# Patient Record
Sex: Male | Born: 1935 | Race: White | Hispanic: No | Marital: Married
Health system: Southern US, Community
[De-identification: ages and names within clinical notes are randomized; demographics above are authoritative.]

---

## 2006-11-24 ENCOUNTER — Ambulatory Visit: Payer: Self-pay | Admitting: Ophthalmology

## 2006-12-01 ENCOUNTER — Ambulatory Visit: Payer: Self-pay | Admitting: Ophthalmology

## 2007-05-06 ENCOUNTER — Ambulatory Visit: Payer: Self-pay | Admitting: Gastroenterology

## 2011-07-17 ENCOUNTER — Ambulatory Visit: Payer: Self-pay | Admitting: Orthopedic Surgery

## 2011-07-29 ENCOUNTER — Ambulatory Visit: Payer: Self-pay | Admitting: Orthopedic Surgery

## 2011-08-08 ENCOUNTER — Inpatient Hospital Stay: Payer: Self-pay | Admitting: Orthopedic Surgery

## 2011-08-12 ENCOUNTER — Encounter: Payer: Self-pay | Admitting: Internal Medicine

## 2011-08-12 LAB — PATHOLOGY REPORT

## 2011-08-17 ENCOUNTER — Encounter: Payer: Self-pay | Admitting: Internal Medicine

## 2011-09-16 ENCOUNTER — Encounter: Payer: Self-pay | Admitting: Internal Medicine

## 2011-12-26 ENCOUNTER — Inpatient Hospital Stay: Payer: Self-pay | Admitting: Orthopedic Surgery

## 2011-12-26 LAB — CBC
HGB: 11.9 g/dL — ABNORMAL LOW (ref 13.0–18.0)
MCH: 30.4 pg (ref 26.0–34.0)
Platelet: 178 10*3/uL (ref 150–440)
RBC: 3.9 10*6/uL — ABNORMAL LOW (ref 4.40–5.90)
WBC: 7.1 10*3/uL (ref 3.8–10.6)

## 2011-12-26 LAB — PROTIME-INR
INR: 1.1
Prothrombin Time: 14.4 secs (ref 11.5–14.7)

## 2011-12-26 LAB — TROPONIN I: Troponin-I: <0.02 ng/mL

## 2011-12-26 LAB — COMPREHENSIVE METABOLIC PANEL
Alkaline Phosphatase: 84 U/L (ref 50–136)
Anion Gap: 12 (ref 7–16)
BUN: 19 mg/dL — ABNORMAL HIGH (ref 7–18)
Bilirubin,Total: 0.5 mg/dL (ref 0.2–1.0)
Chloride: 104 mmol/L (ref 98–107)
Co2: 24 mmol/L (ref 21–32)
Creatinine: 1.14 mg/dL (ref 0.60–1.30)
EGFR (Non-African Amer.): >60
Glucose: 169 mg/dL — ABNORMAL HIGH (ref 65–99)
Osmolality: 286 (ref 275–301)
SGPT (ALT): 19 U/L
Sodium: 140 mmol/L (ref 136–145)
Total Protein: 7.3 g/dL (ref 6.4–8.2)

## 2011-12-26 LAB — URINALYSIS, COMPLETE
Bacteria: NONE SEEN
Bilirubin,UR: NEGATIVE
Blood: NEGATIVE
Glucose,UR: NEGATIVE mg/dL (ref 0–75)
Ketone: NEGATIVE
Specific Gravity: 1.016 (ref 1.003–1.030)
WBC UR: <1 /HPF (ref 0–5)

## 2011-12-27 LAB — CBC WITH DIFFERENTIAL/PLATELET
Basophil #: 0 10*3/uL (ref 0.0–0.1)
Basophil %: 0.3 %
Basophil %: 0.4 %
Eosinophil #: 0 10*3/uL (ref 0.0–0.7)
Eosinophil %: 0.1 %
HCT: 26.5 % — ABNORMAL LOW (ref 40.0–52.0)
HGB: 10.1 g/dL — ABNORMAL LOW (ref 13.0–18.0)
Lymphocyte #: 0.7 10*3/uL — ABNORMAL LOW (ref 1.0–3.6)
Lymphocyte #: 0.7 10*3/uL — ABNORMAL LOW (ref 1.0–3.6)
Lymphocyte %: 10.2 %
Lymphocyte %: 9.9 %
MCH: 31.2 pg (ref 26.0–34.0)
MCHC: 33.8 g/dL (ref 32.0–36.0)
Monocyte #: 0.5 10*3/uL (ref 0.0–0.7)
Monocyte #: 0.8 10*3/uL — ABNORMAL HIGH (ref 0.0–0.7)
Monocyte %: 8.2 %
Neutrophil %: 81.2 %
Neutrophil %: 76.5 %
Platelet: 143 10*3/uL — ABNORMAL LOW (ref 150–440)
Platelet: 125 10*3/uL — ABNORMAL LOW (ref 150–440)
RBC: 3.32 10*6/uL — ABNORMAL LOW (ref 4.40–5.90)
RDW: 16.1 % — ABNORMAL HIGH (ref 11.5–14.5)
WBC: 6.4 10*3/uL (ref 3.8–10.6)

## 2011-12-27 LAB — BASIC METABOLIC PANEL
Anion Gap: 12 (ref 7–16)
BUN: 21 mg/dL — ABNORMAL HIGH (ref 7–18)
Co2: 23 mmol/L (ref 21–32)
Creatinine: 1.15 mg/dL (ref 0.60–1.30)
EGFR (African American): >60
EGFR (Non-African Amer.): >60
Osmolality: 287 (ref 275–301)
Sodium: 140 mmol/L (ref 136–145)

## 2011-12-28 LAB — BASIC METABOLIC PANEL
Anion Gap: 10 (ref 7–16)
BUN: 18 mg/dL (ref 7–18)
Calcium, Total: 7.7 mg/dL — ABNORMAL LOW (ref 8.5–10.1)
Chloride: 106 mmol/L (ref 98–107)
Glucose: 134 mg/dL — ABNORMAL HIGH (ref 65–99)
Osmolality: 281 (ref 275–301)
Potassium: 4 mmol/L (ref 3.5–5.1)
Sodium: 139 mmol/L (ref 136–145)

## 2011-12-28 LAB — CBC WITH DIFFERENTIAL/PLATELET
Basophil #: 0 10*3/uL (ref 0.0–0.1)
Basophil %: 0.4 %
Eosinophil #: 0 10*3/uL (ref 0.0–0.7)
Lymphocyte #: 0.9 10*3/uL — ABNORMAL LOW (ref 1.0–3.6)
Lymphocyte %: 17.8 %
MCV: 93 fL (ref 80–100)
Monocyte #: 0.8 10*3/uL — ABNORMAL HIGH (ref 0.0–0.7)
Neutrophil #: 3.4 10*3/uL (ref 1.4–6.5)
Neutrophil %: 66.4 %
RBC: 2.53 10*6/uL — ABNORMAL LOW (ref 4.40–5.90)
RDW: 15.9 % — ABNORMAL HIGH (ref 11.5–14.5)
WBC: 5.2 10*3/uL (ref 3.8–10.6)

## 2011-12-29 LAB — CBC WITH DIFFERENTIAL/PLATELET
Basophil %: 0.2 %
Eosinophil #: 0.1 10*3/uL (ref 0.0–0.7)
Eosinophil %: 2.1 %
HGB: 9.6 g/dL — ABNORMAL LOW (ref 13.0–18.0)
Lymphocyte #: 1.2 10*3/uL (ref 1.0–3.6)
Lymphocyte %: 16.6 %
MCH: 30.4 pg (ref 26.0–34.0)
MCHC: 33.2 g/dL (ref 32.0–36.0)
MCV: 92 fL (ref 80–100)
Monocyte #: 0.8 10*3/uL — ABNORMAL HIGH (ref 0.0–0.7)
Neutrophil #: 5 10*3/uL (ref 1.4–6.5)
WBC: 7.1 10*3/uL (ref 3.8–10.6)

## 2011-12-30 LAB — CBC WITH DIFFERENTIAL/PLATELET
Basophil #: 0 10*3/uL (ref 0.0–0.1)
Eosinophil #: 0.2 10*3/uL (ref 0.0–0.7)
HCT: 28 % — ABNORMAL LOW (ref 40.0–52.0)
HGB: 9.1 g/dL — ABNORMAL LOW (ref 13.0–18.0)
Lymphocyte #: 0.8 10*3/uL — ABNORMAL LOW (ref 1.0–3.6)
Lymphocyte %: 14.3 %
MCH: 30 pg (ref 26.0–34.0)
MCHC: 32.4 g/dL (ref 32.0–36.0)
MCV: 93 fL (ref 80–100)
Monocyte #: 0.6 10*3/uL (ref 0.0–0.7)
Neutrophil %: 70.4 %
Platelet: 110 10*3/uL — ABNORMAL LOW (ref 150–440)
RDW: 16.5 % — ABNORMAL HIGH (ref 11.5–14.5)
WBC: 5.7 10*3/uL (ref 3.8–10.6)

## 2011-12-31 ENCOUNTER — Encounter: Payer: Self-pay | Admitting: Internal Medicine

## 2011-12-31 LAB — CBC WITH DIFFERENTIAL/PLATELET
Basophil #: 0 10*3/uL (ref 0.0–0.1)
Eosinophil #: 0.4 10*3/uL (ref 0.0–0.7)
Eosinophil %: 8.7 %
HCT: 26.6 % — ABNORMAL LOW (ref 40.0–52.0)
HGB: 8.7 g/dL — ABNORMAL LOW (ref 13.0–18.0)
Lymphocyte %: 18.1 %
MCH: 30.2 pg (ref 26.0–34.0)
MCHC: 32.7 g/dL (ref 32.0–36.0)
Monocyte #: 0.5 10*3/uL (ref 0.0–0.7)
Monocyte %: 10.8 %
Neutrophil %: 62 %
Platelet: 117 10*3/uL — ABNORMAL LOW (ref 150–440)
WBC: 4.5 10*3/uL (ref 3.8–10.6)

## 2011-12-31 LAB — BASIC METABOLIC PANEL
Calcium, Total: 7.7 mg/dL — ABNORMAL LOW (ref 8.5–10.1)
Co2: 22 mmol/L (ref 21–32)
EGFR (Non-African Amer.): >60
Osmolality: 287 (ref 275–301)
Potassium: 3.8 mmol/L (ref 3.5–5.1)
Sodium: 142 mmol/L (ref 136–145)

## 2012-01-17 ENCOUNTER — Encounter: Payer: Self-pay | Admitting: Internal Medicine

## 2014-02-11 ENCOUNTER — Ambulatory Visit: Payer: Self-pay | Admitting: Orthopedic Surgery

## 2015-02-16 ENCOUNTER — Inpatient Hospital Stay: Payer: Self-pay | Admitting: Internal Medicine

## 2015-03-20 ENCOUNTER — Inpatient Hospital Stay
Admit: 2015-03-20 | Disposition: A | Discharge status: Skilled Nursing Facility | Payer: Self-pay | Attending: Internal Medicine | Admitting: Internal Medicine

## 2015-03-20 LAB — CBC
HCT: 34.4 % — ABNORMAL LOW (ref 40.0–52.0)
HGB: 10.9 g/dL — ABNORMAL LOW (ref 13.0–18.0)
MCH: 29.4 pg (ref 26.0–34.0)
MCHC: 31.8 g/dL — AB (ref 32.0–36.0)
MCV: 93 fL (ref 80–100)
Platelet: 219 10*3/uL (ref 150–440)
RBC: 3.71 10*6/uL — ABNORMAL LOW (ref 4.40–5.90)
RDW: 17.1 % — ABNORMAL HIGH (ref 11.5–14.5)
WBC: 8.4 10*3/uL (ref 3.8–10.6)

## 2015-03-20 LAB — BASIC METABOLIC PANEL
ANION GAP: 9 (ref 7–16)
BUN: 12 mg/dL
Calcium, Total: 8.4 mg/dL — ABNORMAL LOW
Chloride: 107 mmol/L
Co2: 25 mmol/L
Creatinine: 0.94 mg/dL
EGFR (African American): >60
EGFR (Non-African Amer.): >60
Glucose: 126 mg/dL — ABNORMAL HIGH
Potassium: 3.8 mmol/L
SODIUM: 141 mmol/L

## 2015-03-20 LAB — TROPONIN I
Troponin-I: 0.18 ng/mL — ABNORMAL HIGH
Troponin-I: 0.18 ng/mL — ABNORMAL HIGH
Troponin-I: 0.18 ng/mL — ABNORMAL HIGH

## 2015-03-20 LAB — PRO B NATRIURETIC PEPTIDE: B-Type Natriuretic Peptide: 701 pg/mL — ABNORMAL HIGH

## 2015-03-20 LAB — RAPID INFLUENZA A&B ANTIGENS

## 2015-03-21 LAB — LIPID PANEL
CHOLESTEROL: 144 mg/dL
HDL Cholesterol: 28 mg/dL — ABNORMAL LOW
LDL CHOLESTEROL, CALC: 98 mg/dL
TRIGLYCERIDES: 91 mg/dL
VLDL Cholesterol, Calc: 18 mg/dL

## 2015-03-21 LAB — BASIC METABOLIC PANEL
Anion Gap: 7 (ref 7–16)
BUN: 12 mg/dL
CHLORIDE: 100 mmol/L — AB
Calcium, Total: 8.2 mg/dL — ABNORMAL LOW
Co2: 32 mmol/L
Creatinine: 1.12 mg/dL
EGFR (Non-African Amer.): 58 — ABNORMAL LOW
Glucose: 121 mg/dL — ABNORMAL HIGH
Potassium: 3.4 mmol/L — ABNORMAL LOW
SODIUM: 139 mmol/L

## 2015-03-21 LAB — CBC WITH DIFFERENTIAL/PLATELET
BASOS ABS: 0 10*3/uL (ref 0.0–0.1)
Basophil %: 0.7 %
Eosinophil #: 0.1 10*3/uL (ref 0.0–0.7)
Eosinophil %: 1.8 %
HCT: 33.5 % — AB (ref 40.0–52.0)
HGB: 10.5 g/dL — ABNORMAL LOW (ref 13.0–18.0)
Lymphocyte #: 1.1 10*3/uL (ref 1.0–3.6)
Lymphocyte %: 23.6 %
MCH: 29.1 pg (ref 26.0–34.0)
MCHC: 31.2 g/dL — ABNORMAL LOW (ref 32.0–36.0)
MCV: 93 fL (ref 80–100)
MONO ABS: 0.3 x10 3/mm (ref 0.2–1.0)
MONOS PCT: 7.3 %
Neutrophil #: 3.1 10*3/uL (ref 1.4–6.5)
Neutrophil %: 66.6 %
PLATELETS: 226 10*3/uL (ref 150–440)
RBC: 3.6 10*6/uL — ABNORMAL LOW (ref 4.40–5.90)
RDW: 16.6 % — AB (ref 11.5–14.5)
WBC: 4.7 10*3/uL (ref 3.8–10.6)

## 2015-03-22 LAB — BASIC METABOLIC PANEL
ANION GAP: 8 (ref 7–16)
BUN: 15 mg/dL
CALCIUM: 8.5 mg/dL — AB
CHLORIDE: 101 mmol/L
Co2: 31 mmol/L
Creatinine: 1.14 mg/dL
EGFR (African American): >60
GFR CALC NON AF AMER: 56 — AB
Glucose: 107 mg/dL — ABNORMAL HIGH
Potassium: 3.7 mmol/L
Sodium: 140 mmol/L

## 2015-03-22 LAB — MAGNESIUM: Magnesium: 1.9 mg/dL

## 2015-04-09 NOTE — Consult Note (Signed)
Brief Consult Note: Diagnosis: Preop Evaluation, HTN, diet controlled DM, hyperlipidemia, now with Right proximal femur fracture.   Patient was seen by consultant.   Consult note dictated.   Recommend to proceed with surgery or procedure.   Orders entered.   Comments: EKG nsr, no acute ischemia, pt had [facility] in [facility]/12 no ischemia, pt had left TKR in 8/12 without any problems, pt is active, no cardiac symptoms, low cardiac risk for procedure, cont lisinopril.  Electronic Signatures: Fredia SorrowGupta, Shantea Poulton (MD)  (Signed 10-Jan-13 20:55)  Authored: Brief Consult Note   Last Updated: 10-Jan-13 20:55 by Fredia SorrowGupta, Lilyana Lippman (MD)

## 2015-04-09 NOTE — Discharge Summary (Signed)
PATIENT NAME:  Philip [name], Philip [name] MR#:  [id number] DATE OF BIRTH:  1922/12/29  DATE OF ADMISSION:  12/26/2011 DATE OF DISCHARGE:  12/31/2011  ADMISSION DIAGNOSIS: Right intertrochanteric hip fracture.   DISCHARGE DIAGNOSES:  1. Right intertrochanteric hip fracture treated with intramedullary fixation.  2. Acute postoperative blood loss anemia.   HISTORY OF PRESENT ILLNESS: Mr. Philip [name] is an [age] year old male who fell off a stepladder at home onto his right side. He was brought to [facility] where he was diagnosed with an intertrochanteric hip fracture. Patient was admitted to the orthopedic surgery service for further evaluation and management.   HOSPITAL COURSE: Patient was seen by the [facility] medicine service on 12/26/2011. He was cleared for surgery at that time.   Patient was brought to the Operating Room on 12/27/2011 and underwent an uncomplicated open reduction internal fixation with a long intramedullary rod. Postoperatively, he was brought back to the orthopedic floor. On postoperative day #1 patient was found to have hematocrit of 23 with a hemoglobin level of 7.8. For this 2 units of packed red blood cells were transfused. Patient otherwise was doing well and was up out of bed to a chair. Physical and occupational therapy consults were ordered for postoperative day #1 and they continued to follow him throughout his hospital stay. Patient made adequate progress with therapy throughout his hospitalization. Patient had 24 hours of antibiotics postop. He is on vancomycin due to a penicillin allergy. He received two doses postoperatively. On postoperative day #2 the patient's Foley catheter was removed. His hematocrit was up to 28. Patient was able to have a bowel movement as well. Patient was up and ambulating, was able to walk to the door of his room. On postoperative day #3 patient continued to do well. He continued to progress with physical therapy. On  postoperative day #4 patient again was doing well. He was sitting up eating lunch. He denied any right hip pain. He had been up and walking with physical therapy and increasing his distance. Patient had mild loose stools. His vital signs were stable. His hematocrit was 26.6. His electrolytes were within normal limits.   Given the patient's clinical improvement, he is prepared for discharge to rehab.   DISCHARGE INSTRUCTIONS: Patient will be discharged with instructions to continue physical and occupational therapy. He will be weight-bearing as tolerated on the right lower extremity. He should elevate the right lower extremity whenever he is in bed. He will continue on enteric-coated aspirin 325 mg daily for deep vein thrombosis prophylaxis. His staples will be removed in the orthopedic office upon his first follow up in 7 to 10 days. Patient should have a CBC checked within 48 hours of [facility] to ensure a stable hematocrit. He may apply ice to the right hip to help reduce swelling. Patient will remain on Vicodin 5/325 mg 1 to 2 tablets q.4-6 hours p.[name].n. for pain.    DISCHARGE MEDICATIONS: Patient may restart his home medications. He will also be on: 1. Norco 5/325 mg 1 to 2 tabs every 4 to 6 hours p.[name].n. for pain.  2. Enteric-coated aspirin 325 mg daily.  3. Calcium carbonate 500 mg with 200 international units vitamin D 1 tablet b.i.d. with meals. 4. Celebrex 200 mg p.o. daily.  5. Ferrous sulfate 325 mg p.o. b.i.d.  6. Lisinopril 5 mg p.o. daily. 7. Fosamax 70 mg q. week. He is to take this medication with 6 to 8 ounces of water at least 30 minutes before  his first meal of the day and he must remain upright for at least 30 minutes after taking the tablet.  8. Colace 100 mg b.i.d.  9. While he was an inpatient he was covered by insulin sliding scale with fingersticks before each meals and at bedtime. If appropriate he may continue sliding scale at rehab.  10. MiraLax 17 grams p.o.  daily p.[name].n. for constipation.   ____________________________ Kathreen Devoid, MD klk:cms D: 12/31/2011 12:34:24 ET T: 12/31/2011 13:04:30 ET JOB#: [id number]  cc: Kathreen Devoid, MD, <Dictator> Kathreen Devoid MD ELECTRONICALLY SIGNED 01/01/2012 18:28

## 2015-04-09 NOTE — Op Note (Signed)
PATIENT NAME:  Philip [name], Philip [name] MR#:  [id number] DATE OF BIRTH:  [birth year]  DATE OF PROCEDURE:  12/27/2011  PREOPERATIVE DIAGNOSIS: Right transverse, displaced intertrochanteric hip fracture.   POSTOPERATIVE DIAGNOSIS: Right transverse, displaced intertrochanteric hip fracture.  PROCEDURE: Open reduction internal fixation of right intertrochanteric hip fracture with intramedullary nail.   SURGEON: Kathreen DevoidKevin L. Madeliene Tejera, MD   ANESTHESIA: Spinal.   ESTIMATED BLOOD LOSS: 150 mL.   COMPLICATIONS: None.   INDICATIONS FOR PROCEDURE: The patient is an 81 year old male. He is an independent ambulator at baseline and sustained a mechanical fall off a stepladder at home yesterday. The patient landed on his right side when he impacted the ground. He was brought to the [facility] where he was diagnosed with the above-noted fracture by x-ray. He was admitted to my service for further evaluation and management. Given the transverse nature of the fracture and the displacement, I have recommended surgery for open reduction internal fixation. I discussed the risks and benefits of surgery with the patient and his family this morning at the bedside. They understand the risks include infection, bleeding requiring blood transfusion, nerve or blood vessel injury, malunion, nonunion, leg length discrepancy, change in leg rotation as well as persistent right hip pain, osteoarthritis, and the need for further surgery including conversion to a right total hip arthroplasty. Medical complications they understand include DVT and pulmonary embolism, myocardial infarction, pneumonia, stroke, respiratory failure and death. The patient signed consent form for blood transfusion and surgery at the bedside this morning in the presence of his wife and daughter as well as the nurse, Selena BattenKim.   PROCEDURE NOTE: The patient had his right hip signed with the word yes according to the right site protocol. He was  brought up to the operating room where he underwent a spinal anesthetic placed by our anesthesia service. He was then positioned supine on a fracture table. The right leg was placed in a legholder with traction. The left well leg was placed in a hemi- lithotomy position. The patient was then prepped and draped in a sterile fashion. Time-out was performed to verify the patient's name, date of birth, medical record number, correct site of surgery, and correct procedure to be performed. It was also used to verify the patient had received antibiotics and that all appropriate instruments, implants, and radiographic studies were available in the room. Once all in attendance were in agreement, the case began. The patient had 1 gram of vancomycin before the incision was made because he has a penicillin allergy.   The hip was placed in traction and internally and externally rotated in an effort to close reduce the fracture. Unfortunately, this was not possible to get the fracture well aligned through closed techniques. Therefore, after the patient was prepped and draped in a sterile fashion a lateral incision was made centered just below the greater trochanter. The incision was approximately 8 cm in length. The overlying subcutaneous tissue was cleared off the vastus lateralis with a [employer] therapist[employer] elevator. The vastus lateralis was then sharply incised revealing the underlying vastus lateralis. The vastus lateralis was found to be quite edematous and there was hematoma within it. The vastus lateralis was split bluntly within its fibers. Hematoma was evacuated and the fracture was palpated. A three-pronged fracture clamp was then placed around the fracture. This was then carefully tightened to pull the fracture into reduction. There had been translation of the shaft medially in relation to the proximal fragment which was slightly abducted and laterally  displaced. There was a fragment of the lesser trochanter off. Following placement  of "[place] claw" fracture clamp, the fracture was well aligned. A second incision was then made above the greater trochanter approximately 3 cm in length to allow insertion of a guidepin for intramedullary nail to be placed into the tip of the greater trochanter. This guidepin was advanced into the proximal femur and across the fracture site. Its position was checked on AP and lateral C-arm images. Once the guidepin was in good position, the guidepin was overdrilled with a proximal femoral drill. This was 15 mm in diameter. The soft tissue was protected from this drill with a drill tissue protecting guide. The drill was then removed. A long ball-tip guidewire was then placed through the proximal drill hole and into the femoral shaft and guided down to the knee. Again, the guidewire's position was confirmed on AP and lateral C-arm projections both proximally and distally at the knee. The length of the intramedullary nail was then measured. It was found a 420 mm nail would be adequate length. An 11 mm x 420 mm intramedullary nail was then inserted over the ball-tip guidewire. Its position was confirmed again proximally and distally on AP and lateral C-arm projections. Once the nail was in adequate position, the fracture clamp was released. The drill guide for the lag screw was then placed through the guide arm of the intramedullary nail. The guidepin was then advanced across the fracture and into the femoral head. It was measured to be 110 mm in length. A lag screw drill was then used to drill a hole of 110 mm in depth. The position of the lag screw guidewire had been confirmed on both the AP and lateral projections to ensure a tip apex distance less than 25 mm. The 110 mm lag screw was then advanced by hand into position fixing the proximal portion of the fracture. Again, the construct was checked on AP and lateral C-arm projections. The screw within the superior portion of the nail was then tightened into position  and backed off a half turn. This allowed stability of the lag screw and prevented rotation and back out of the lag screw once positioned. The guide arm was then removed from the proximal portion of the nail. The attention was then turned to distal fixation.   A perfect circle technique was utilized to place a single distal interlocking screw through the oblong hole. This would allow for compression at the fracture site. Prior to placement of the distal interlocking screw, traction of the right lower extremity was removed. A single 52 mm distal interlocking screw was placed to allow for fixation distally. Once drilled and in position, the final position of the distal interlocking screw was again confirmed on AP and lateral C-arm images. Final C-arm images were then performed of the entire right intramedullary nail. The wounds were then copiously irrigated. The fascia lata was closed with interrupted 0 Vicryl in both the superior and middle incisions. The subcutaneous tissue was closed with 2-0 Vicryl in all three incisions and the skin was approximated with staples. Dry sterile dressings were applied over all three incisions. I was scrubbed and present for the entire case and all sharp and instrument counts were correct at the conclusion of the case. The patient was brought to the PAC-U in stable condition.   I spoke with the patient's family in the postop consultation room to let them know that the case had gone without complication and the patient  was stable in the recovery room.    ____________________________ Kathreen Devoid, MD klk:drc D: 12/27/2011 17:19:37 ET T: 12/28/2011 09:20:20 ET JOB#: [id number]  cc: Kathreen Devoid, MD, <Dictator> Kathreen Devoid MD ELECTRONICALLY SIGNED 01/01/2012 18:28

## 2015-04-09 NOTE — Consult Note (Signed)
PATIENT NAME:  Philip [name], Xzavior R MR#:  [id number] DATE OF BIRTH:  Oct 16, 1923  DATE OF CONSULTATION:  12/26/2011  REFERRING PHYSICIAN:  Juanell FairlyKevin Krasinski, MD  CONSULTING PHYSICIAN:  Fredia SorrowAbhinav Philopateer Strine, MD  PRIMARY CARE PHYSICIAN: Alonna BucklerAndrew Lamb, MD   REASON FOR CONSULTATION: Preop evaluation, EKG interpretation.   CHIEF COMPLAINT: I feel like some heavy sensation in the right hip area.   HISTORY OF PRESENT ILLNESS: This is an 81 year old male who is fairly healthy. He has history of hypertension, diabetes which is diet controlled, history of prostate cancer, and hyperlipidemia. He had a left total knee replacement done in August of 2012. Today while he was on the ladder, he was only two steps on the ladder, and when he climbed down he fell and he fell on his right side sustaining a right hip fracture. His right hip x-ray showed a proximal right femoral fracture. He is not complaining of any significant pain in the right hip area right now. He denies any chest pain, shortness of breath, abdominal pain, dizziness, nausea or vomiting. He denies any loss of consciousness. He says he is very active. He does household work. He does yard work. He did well with his previous surgery, left knee replacement. He had a good recovery and he was mowing the lawn after his left total knee replacement. He denies any chest pains on exertion, any shortness of breath on exertion. He said he had a stress test that was done prior to left total knee replacement.  He had a stress echo done in June of 2012 which showed normal stress echocardiographic images without evidence of ischemia, poor exercise tolerance for age. Normal EKG without evidence of ischemia.   PAST MEDICAL HISTORY:  1. Hypertension.  2. Diabetes, diet controlled. 3. History of prostate cancer.  4. Hyperlipidemia.  5. Gout.  6. Severe degenerative disease of his knees.   PAST SURGICAL HISTORY: 1. Left total knee replacement in August of 2012. 2. Prostate  resection in 1995.   ALLERGIES TO MEDICATIONS: Penicillin.   HOME MEDICATIONS: 1. Garlic 400 mg daily.  2. Lisinopril 5 mg daily.  3. Aspirin 81 mg daily.  4. Multivitamin. 5. MiraLAX as needed.   SOCIAL HISTORY: He lives with his wife. No smoking or alcohol use.   FAMILY HISTORY: Father died at age of 79. Mother died at 6890. His one son had heart problems. He was a smoker. One child died of brain cancer.   PHYSICAL EXAMINATION:   VITAL SIGNS: In the Emergency Room temperature 97.3, heart rate 81, respiratory rate 16, blood pressure 164/95, saturating 98% on room air.   GENERAL: This is an elderly Caucasian male, well built, comfortably lying in bed, very pleasant gentleman.  HEENT: Bilateral pupils are equal. Extraocular muscles are intact. No scleral icterus. No conjunctivitis. Oral mucosa is moist. No pallor.   NECK: No thyroid tenderness, enlargement, or nodules. Neck is supple. No masses, nontender. No adenopathy. No JVD. No carotid bruit.   CHEST: Bilateral breath sounds are clear anteriorly. No wheezing. Normal effort. No respiratory distress.   HEART: Heart sounds are regular. There is a murmur. Good peripheral pulses. No lower extremity edema.   ABDOMEN: Soft, nontender. Normal bowel sounds. No hepatosplenomegaly. No bruit. No masses.   RECTAL: Deferred.   NEUROLOGIC: He is awake, alert, oriented to time, place, and person. Cranial nerves are intact. He is moving bilateral upper and left lower extremity against gravity. Right lower extremity is externally rotated and immobilized right now but he  can move his right toes.   LABORATORY, DIAGNOSTIC, AND RADIOLOGICAL DATA: White count 7.1, hemoglobin 11.9, platelet count 178,000. BMP sodium 140, potassium 3.8, BUN 19, creatinine 1.14. CK 233. MB 7.0. Troponin negative. His EKG shows sinus rhythm, left axis deviation but essentially normal EKG. No acute ischemic changes. He has some Q waves in inferior leads but no change from  prior EKGs.   IMPRESSION:  1. Preoperative evaluation. 2. Hypertension. 3. Diet controlled diabetes.  4. History of prostate cancer.  5. Hyperlipidemia.  6. Proximal right femoral neck fracture.  PLAN: This is an 81 year old  male who has history of hypertension, diet controlled diabetes, and history of prostate cancer. He is fairly active. He has no exertional chest pains or dyspnea. He had a stress echo in June of 2012 that was normal. He underwent a left total knee replacement in August of 2012 without any complications. He presented with a right hip fracture today. His EKG does not show any acute ischemic changes, unchanged from prior EKGs. Will continue his low dose lisinopril. Continue IV hydration. There is no need for perioperative beta-blockers. He is low cardiac risk for procedure. Can proceed with surgery. Will continue to follow the patient while in the hospital.   TIME SPENT WITH CONSULTATION: 40 minutes.   ____________________________ Fredia Sorrow, MD ag:drc D: 12/26/2011 20:52:43 ET T: 12/27/2011 09:21:01 ET JOB#: [id number]  cc: Fredia Sorrow, MD, <Dictator>, Reola Mosher. Randa Lynn, MD, Kathreen Devoid, MD Fredia Sorrow MD ELECTRONICALLY SIGNED 01/10/2012 10:52

## 2015-04-09 NOTE — H&P (Signed)
Subjective/Chief Complaint Right intertrochanteric hip fracture    History of Present Illness Patient is an 81 y/o male sustained a mechanical fall off a step later yesterday at home.  He recalls hitting the ground with significant force onto the right side.  Patient is an independent ambulator at baseline.  He is seen this AM with his family at the bedside.   Past Med/Surgical Hx:  weber  christian disease:   gout:   NIDDM:   HTN:   [rpstate resection om 1995:   hydrocele:   surgical removal of bilateral cataracts:   left total knee replacement:   ALLERGIES:  Penicillin: Unknown  HOME MEDICATIONS:  lisinopril 5 mg oral tablet: 1 tab(s) orally once a day (in the morning), Active  aspirin 81 mg oral tablet: 1 tab(s) orally once a day, Active  multivitamin: 1 tab(s) orally once a day, Active  garlic oral tablet: 1 tab(s) orally once a day, Active  MiraLax oral powder for reconstitution: 30 gram(s) orally , As Needed, Active  Family and Social History:   Family History Non-Contributory    Place of Living Home   Review of Systems:   Subjective/Chief Complaint Right hip  pain    Fever/Chills No    Cough No    Abdominal Pain No    Nausea/Vomiting No    SOB/DOE No    Chest Pain No   Physical Exam:   GEN NAD    HEENT PERRL, hearing intact to voice, moist oral mucosa, Oropharynx clear    NECK supple  No masses  trachea midline    RESP normal resp effort  clear BS  no use of accessory muscles    CARD regular rate  no murmur  No LE edema  no JVD    ABD denies tenderness  no liver/spleen enlargement  soft  normal BS  no Adominal Mass    EXTR Right lower extremity without erythema or ecchymosis.  Right leg in Buck's traction.  NVI in bilateral lower extemities.  Motor function intact in both lower extremities.    SKIN normal to palpation    NEURO motor/sensory function intact    PSYCH alert, A+O to time, place, person   Cardiac:  10-Jan-13 18:23    CK,  Total 233   CPK-MB, Serum 7.0  Routine Hem:  10-Jan-13 18:23    WBC (CBC) 7.1   RBC (CBC) 3.90   Hemoglobin (CBC) 11.9   Hematocrit (CBC) 35.9   Platelet Count (CBC) 178   MCV 92   MCH 30.4   MCHC 33.1   RDW 16.5  Routine Chem:  10-Jan-13 18:23    Glucose, Serum 169   BUN 19   Creatinine (comp) 1.14   Sodium, Serum 140   Potassium, Serum 3.8   Chloride, Serum 104   CO2, Serum 24   Calcium (Total), Serum 8.7  Hepatic:  10-Jan-13 18:23    Bilirubin, Total 0.5   Alkaline Phosphatase 84   SGPT (ALT) 19   SGOT (AST) 27   Total Protein, Serum 7.3   Albumin, Serum 3.9  Routine Chem:  10-Jan-13 18:23    Osmolality (calc) 286   eGFR (African American) >60   eGFR (Non-African American) >60   Anion Gap 12  Cardiac:  10-Jan-13 18:23    Troponin I < 0.02  Routine Coag:  10-Jan-13 18:23    Activated PTT (APTT) 26.4   Prothrombin 14.4   INR 1.1  Routine BB:  10-Jan-13 20:13  Antibody Screen NEGATIVE   Crossmatch Unit 1 Ready   Crossmatch Unit 2 Ready  Routine UA:  10-Jan-13 21:33    Color (UA) Yellow   Clarity (UA) Clear   Glucose (UA) Negative   Bilirubin (UA) Negative   Ketones (UA) Negative   Specific Gravity (UA) 1.016   Blood (UA) Negative   pH (UA) 5.0   Protein (UA) Negative   Nitrite (UA) Negative   Leukocyte Esterase (UA) Negative   RBC (UA) 1 /HPF   WBC (UA) <1 /HPF   Mucous (UA) PRESENT  Routine Hem:  11-Jan-13 01:13    WBC (CBC) 6.4   RBC (CBC) 3.32   Hemoglobin (CBC) 10.1   Hematocrit (CBC) 30.8   Platelet Count (CBC) 143   MCV 93   MCH 30.4   MCHC 32.9   RDW 16.5  Routine Chem:  11-Jan-13 01:13    Glucose, Serum 178   BUN 21   Creatinine (comp) 1.15   Sodium, Serum 140   Potassium, Serum 4.2   Chloride, Serum 105   CO2, Serum 23   Calcium (Total), Serum 8.3   Osmolality (calc) 287   eGFR (African American) >60   eGFR (Non-African American) >60   Anion Gap 12  Routine Hem:  11-Jan-13 01:13    Neutrophil % 81.2   Lymphocyte  % 10.2   Monocyte % 8.2   Eosinophil % 0.1   Basophil % 0.3   Neutrophil # 5.2   Lymphocyte # 0.7   Monocyte # 0.5   Eosinophil # 0.0   Basophil # 0.0   Radiology Results: XRay:    10-Jan-13 18:38, Chest 1 View AP or PA   Chest 1 View AP or PA   PRELIMINARY REPORT    The following is a PRELIMINARY Radiology report.  A final report will follow pending radiologist verification.      REASON FOR EXAM:    preoperative  COMMENTS:       PROCEDURE: DXR - DXR CHEST 1 VIEWAP OR PA  - Dec 26 2011  6:38PM     RESULT: There is no previous exam for comparison.    A single projection shows slightly shallow inspiration with pulmonary   vascular prominence in the hilar regions. There may be some granulomatous   calcification in the area or prominent vessels. The cardiac silhouette   appears normal in size. The lung markings are slightly coarse. There is   no significant effusion or evidence of pneumothorax.    IMPRESSION:  Pulmonary vascular congestion. Given the supine position of   the patient, adenopathy is felt to be less likely. Granulomatous changes     are not excluded. No definite infiltrate or mass otherwise. The heart   appears to be within normal limits for the position and projection.    Thank you for the opportunity to contribute to the care of your patient.           Dictated By: Sundra Aland, M.D., MD    10-Jan-13 18:38, Hip Right Complete   Hip Right Complete   REASON FOR EXAM:    s/p fall, pain  COMMENTS:       PROCEDURE: DXR - DXR HIP RIGHT COMPLETE  - Dec 26 2011  6:38PM     RESULT: And intratrochanteric comminuted fracture of the right proximal   femur is present. No significant impaction or distractionis seen. The   femoral head remains in the acetabulum. The visualized pelvis appears   intact.  IMPRESSION:   1. Proximal right femoral fracture in the intertrochanteric region.          Verified By: Sundra Aland, M.D., MD    10-Jan-13  18:43, Pelvis AP Only   Pelvis AP Only   PRELIMINARY REPORT    The following is a PRELIMINARY Radiology report.  A final report will follow pending radiologist verification.      REASON FOR EXAM:    fall, pain  COMMENTS:       PROCEDURE: DXR - DXR PELVIS AP ONLY  - Dec 26 2011  6:43PM     RESULT: There is a comminuted intertrochanteric fracture of the proximal   right femur. Surgical clips from previous prostatectomy are present.   Degenerative changes are seen in the sacroiliac joints and lower lumbar   region.    IMPRESSION:   1. Intertrochanteric comminuted right femoral fracture.    Thank you for the opportunity to contribute to the care of your patient.       Dictated By: Sundra Aland, M.D., MD     Assessment/Admission Diagnosis Right transverse intertrochanteric hip fracture at the level of the lesser trochanter    Plan I have explained the injury to the patient and his family.  The patient is highly functioning and an independent ambulator at baseline.  Therefore I have recommended surgical fixation of this fracture with an intramedullary nail.  The risks and benefits of surgical intervention were discussed in detail with the patient. The patient expressed understanding of the risks and benefits and agreed with plans for surgery. The risks include, but are not limited to: infection, bleeding requiring transfusion, nerve and blood vessel injury, fracture, leg length discrepancy, change in lower extremity rotation, malunion, nonunion, hardware failure, hip pain, osteoarthritis, need for more surgery including conversion to a total hip arthroplasty, DVT, and PE, MI, stroke, pneumonia, respiratory failure and death.  The patient has been cleared for surgery by [facility].  He is NPO.  Surgery is scheduled for this morning.  I answered all questions by the patient and his family.  Initially the family had hope Dr. Rudene Christians would do his surgery, as he had performed the patient's left  total knee arthroplasty.  I have spoken to Dr. Rudene Christians this AM and he is unavailable to perform the operation in a timely fashion due to the fact that he is in clinic all day.  I have explained this to the patient's family.   Electronic Signatures: Thornton Park (MD)  (Signed 11-Jan-13 10:09)  Authored: CHIEF COMPLAINT and HISTORY, PAST MEDICAL/SURGIAL HISTORY, ALLERGIES, HOME MEDICATIONS, FAMILY AND SOCIAL HISTORY, REVIEW OF SYSTEMS, PHYSICAL EXAM, LABS, Radiology, ASSESSMENT AND PLAN   Last Updated: 11-Jan-13 10:09 by Thornton Park (MD)

## 2015-04-16 NOTE — Discharge Summary (Signed)
PATIENT NAME:  Philip [name], Philip [name] MR#:  [id number] DATE OF BIRTH:  Sep 21, 1923  DATE OF ADMISSION:  03/20/2015 DATE OF DISCHARGE:  03/21/2015  PRIMARY CARE PHYSICIAN: Kandyce Rud, MD   FINAL DIAGNOSES:  1. Acute systolic congestive heart failure, moderate mitral regurgitation.  2. Abnormal CT CAT scan of the chest. Followup CT scan of the chest in 6 weeks recommended. Questionable pneumonia.  3. Weakness.  4. Recent hip fracture.   MEDICATIONS ON DISCHARGE: Include Colace 100 mg 1 capsule at bedtime as needed for constipation, MiraLax 17 grams orally once a day, acetaminophen 325 mg every 4 to 6 hours as needed for mild pain, vitamin D3, 1000 international units 2 capsules daily, Aspercreme 10% topical cream applied to affected area twice a day, tramadol 50 mg every 4 hours as needed for moderate pain, Ramipril 5 mg 1 capsule orally daily, aspirin 81 mg daily, metoprolol 12.5 mg twice a day orally, Lasix 20 mg once a day, Aldactone 25 mg once a week, Levaquin 500 mg 1 tablet every 24 hours for 7 days.   DIET: Low sodium diet, regular consistency.   ACTIVITY: As tolerated.   FOLLOWUP: With doctor at rehab in 1 to 2 days. Physical therapy at rehab.   HOSPITAL COURSE: The patient was admitted 03/20/2015, and discharged 03/22/2015. The patient came in with shortness of breath, unclear etiology, was found to have congestive heart failure.   LABORATORY AND RADIOLOGICAL DATA DURING THE HOSPITAL COURSE: Included an EKG that showed sinus tachycardia, occasional PVCs. Glucose 126, BUN 12, creatinine 0.94. Sodium 141, potassium 3.8, chloride 107, CO2 of 25. Troponin borderline at 0.18. White blood cell count 8.4, hemoglobin and hematocrit 10.9 and 34.4, platelet count 219,000. Chest x-ray stable cardiomegaly, diffuse interstitial prominence, may reflect atypical infection or less likely pulmonary edema, bilateral lower air space opacities. BNP was elevated at 701. CT scan of the chest showed no  pulmonary embolism, evidence of congestive heart failure, focal opacity abutting the major fissure of the left upper lobe in the apex. Suspect a loculated effusion in the area. A mass could present in this manner. A followup study in 4 to 6 weeks to assess stability.   Two troponins borderline at 0.18. Echocardiogram showed an ejection fraction of 30% -35%, dilated left and right atrium, moderate mitral valve regurgitation, moderate aortic valve sclerosis.  LDL 98, HDL 28, triglycerides 91, creatinine upon discharge 1.14, potassium 3.7. Magnesium 1.9.   HOSPITAL COURSE PER PROBLEM LIST:  1. For the patient's acute systolic congestive heart failure with moderate mitral regurgitation, the patient was diuresed with IV Lasix with good response. Lungs are clear upon discharge. We will give Lasix 20 mg on a daily basis. I added Aldactone 25 mg once a week, Ramipril 5 mg daily and metoprolol 12.5 mg twice a day, which are all new medications for this patient.  2. For the abnormal CT scan of the chest, unclear of what this is. I did give a course of antibiotic with Levaquin. Would finish up the course. Recommend a followup CT scan of the chest in 6 weeks to see if that is a loculated effusion or if it improves with antibiotic therapy.  3. Weakness. Physical therapy saw the patient and the patient still needs rehabilitation.  4. Recent hip fracture. Mortality high within the first year of a hip fracture, especially if he does not get walking again. The patient is a DO NOT RESUSCITATE. Tramadol and Tylenol as needed for pain.   Time Spent ON  discharge: 35 minutes.  ____________________________ Herschell Dimesichard J. Renae GlossWieting, MD rjw:AT D: 03/22/2015 10:15:31 ET T: 03/22/2015 10:33:57 ET JOB#: [id number]  cc: Herschell Dimesichard J. Renae GlossWieting, MD, <Dictator> Salley ScarletICHARD J Diante Barley MD ELECTRONICALLY SIGNED 03/23/2015 15:43

## 2015-04-16 NOTE — Consult Note (Signed)
PATIENT NAME:  Philip [name], Merville R MR#:  [id number] DATE OF BIRTH:  1923-07-10  DATE OF CONSULTATION:  02/17/2015  REFERRING PHYSICIAN:  Hilda LiasVivek Sainani, MD CONSULTING PHYSICIAN:  Maryagnes AmosJ. Jeffrey Poppy Mcafee, MD  REASON FOR CONSULTATION: I have been asked by Dr. Cherlynn KaiserSainani to evaluate this pleasant, elderly man for a left hip injury. He is an 81 year old male who lives at home with his wife independently. He often ambulates with either a walker or a cane. He was in his usual state of health yesterday afternoon and was out picking up some sticks in his yard when he fell, landing on his left side. He was brought to the Emergency Room where x-rays demonstrated an intertrochanteric fracture of his left hip. The patient denies any associated injuries secondary to the fall nor does he note any predisposing factors which might have contributed to the fall, such as lightheadedness or dixxiness. He also underwent a trochanteric femoral nailing of a right intertrochanteric fracture in 2013.   PHYSICAL EXAMINATION: GENERAL: We have a pleasant, elderly male in no acute distress. He is alert and oriented x3. He is hard of hearing.  HEENT: Normocephalic, atraumatic. Pupils equal, round and reactive to light. Extraocular movements are intact. Ears nose, and throat are within normal limits. Supple and without adenopathy.  LUNGS: Clear.  HEART: Regular rate and rhythm.  ABDOMEN: Benign. ORTHOPEDIC EXAMINATION: Limited to the left hip and lower extremity. The skin around the left hip is unremarkable. His left lower extremity is slightly shortened and externally rotated as compared to the right leg. He has pain with any attempt at active or passive motion of the left hip. He is able actively dorsiflex and plantarflex his toes. Sensation is intact to light touch to all distributions. He has good capillary refill to his left foot.   DIAGNOSTIC DATA: Admission laboratory data is notable for a slightly decreased platelet count 127,000,  but otherwise his white count is 8.9. His hemoglobin is 13 and his hematocrit is 38.4. His Chem-7 is unremarkable other than a slightly elevated glucose at 119.   X-RAYS: An AP view of his pelvis and left hip are available for review. The findings are as described above.   IMPRESSION: Two-part intertrochanteric fracture of the left hip.   PLAN: The treatment options are discussed with the patient and his family. The patient would most benefit from a trochanteric femoral nailing of this left hip fracture. The procedure was discussed in detail with the patient and his family, as were the potential risks (including bleeding, infection, nerve and/or blood vessel injury, persistent or recurrent pain, stiffness, malunion and/or nonunion, need for further surgery, blood clots, strokes, heart attacks and/or arrhythmias, etc.) and benefits. The patient states his understanding and wishes to proceed. A consent will be obtained by the nurse.   I thank you for asking me to participate in the care of this most pleasant man and his family. I will be happy to follow him with you.   ____________________________ J. Derald MacleodJeffrey Kelechi Orgeron, MD jjp:sb D: 02/17/2015 07:48:39 ET T: 02/17/2015 09:53:24 ET JOB#: [id number]  cc: Maryagnes AmosJ. Jeffrey Vandell Kun, MD, <Dictator> Maryagnes AmosJ. JEFFREY Sewell Pitner MD ELECTRONICALLY SIGNED 02/21/2015 14:23

## 2015-04-16 NOTE — H&P (Signed)
PATIENT NAME:  Philip [name], Philip [name] MR#:  [id number] DATE OF BIRTH:  03-17-1923  DATE OF ADMISSION:  02/16/2015  PRIMARY CARE PHYSICIAN: Kandyce Rud, MD    CHIEF COMPLAINT: Status post fall and left hip fracture.   HISTORY OF PRESENT ILLNESS: This is an 81 year old male who presents to the hospital after he had a mechanical fall outside in his yard. He was having some pain in his left hip area. He called his wife using his cell phone and EMS brought him to the hospital. The patient's x-ray of his left hip was consistent with a left hip fracture. The patient denied any prodromal symptoms prior to his fall like any chest pain, shortness of breath, palpitations, nausea, vomiting, dizziness, or any other associated symptoms.   REVIEW OF SYSTEMS:  CONSTITUTIONAL: No documented fever. No weight gain, no weight loss.  EYES: No blurred or double vision.  EARS, NOSE, AND THROAT: No tinnitus. No postnasal drip. No redness of the oropharynx.  RESPIRATORY: No cough. No wheeze. No hemoptysis. No dyspnea.  CARDIOVASCULAR: No chest pain. No orthopnea. No palpitations. No syncope.  GASTROINTESTINAL: No nausea. No vomiting. No diarrhea. No abdominal pain. No melena. No hematochezia.  GENITOURINARY: No dysuria. No hematuria.  ENDOCRINE: No polyuria. No nocturia. No heat or cold intolerance.  HEMATOLOGIC: No anemia. No bruising. No bleeding.  INTEGUMENT: No rashes. No lesions.  MUSCULOSKELETAL: Positive osteoarthritis. No swelling. No gout.  NEUROLOGIC: No numbness. No tingling. No ataxia. No seizure-type activity.  PSYCHIATRIC: No anxiety. No insomnia. No ADD.   PAST MEDICAL HISTORY: Consistent with a history of osteoarthritis, history of left knee replacement.   ALLERGIES: PENICILLIN, WHICH CAUSES A RASH.   SOCIAL HISTORY: No smoking. No alcohol abuse. No illicit drug abuse. Lives at home with his wife.   FAMILY HISTORY: Mother and father are both deceased. Both died from complications of old age.    CURRENT MEDICATIONS: As follows: Colace 100 mg at bedtime, MiraLax daily as needed, tramadol 50 mg q. 8 hours as needed.   PHYSICAL EXAMINATION: Presently is as follows:  VITAL SIGNS: Noted to be: Temperature is 98, pulse 76, respirations 24, blood pressure of 208/95, saturations 96% on room air.  GENERAL: The patient is a pleasant-appearing male but in no apparent distress. HEAD, EYES, EARS, NOSE AND THROAT: He is atraumatic, normocephalic. Extraocular muscles are intact. Pupils equal and reactive to light. Sclerae anicteric. No conjunctival injection. No pharyngeal erythema.  NECK: Supple. There is no jugular venous distention. No bruits, lymphadenopathy, or thyromegaly.  HEART: Regular rate, rhythm. No murmurs or rubs. No clicks.  LUNGS: Clear to auscultation bilaterally. No rales or rhonchi. No wheezes.  ABDOMEN: Soft, flat, nontender, nondistended. Has good bowel sounds. No hepatosplenomegaly appreciated.  EXTREMITIES: No evidence of any cyanosis, clubbing, or peripheral edema. Has +2 pedal and radial pulses bilaterally. The patient's left lower extremity is externally rotated and shortened due to the hip fracture. There are +2 pedal and radial pulses bilaterally.  NEUROLOGICAL: The patient is alert, awake, oriented x 3 with no focal motor or sensory deficits appreciated bilaterally.  SKIN: Moist, warm with no rashes appreciated.  LYMPHATIC: There is no cervical or axillary lymphadenopathy.   LABORATORY DATA: Showed a serum glucose of 119, BUN 17, creatinine 1.06, sodium 137, potassium 4.1, chloride 104, bicarbonate 27. LFTs are within normal limits. White cell count 8.9, hemoglobin 13.0, hematocrit 38.4, platelet count of 127,000.   IMAGING: The patient did have an x-ray of the left hip which showed left intertrochanteric  fracture. The patient also had a chest x-ray done which showed no acute cardiopulmonary disease.   ASSESSMENT AND PLAN: This is an 81 year old male with a history of  osteoarthritis, history of left knee replacement who presents to the hospital after a mechanical fall and noted to have a left hip fracture.  1.  Preoperative evaluation. The patient is likely a low risk for noncardiac surgery. No contraindication to surgery at this time. The patient's EKG has been reviewed, showed no acute ST- or T-wave changes.  2.  Left hip fracture. This is secondary to a mechanical fall. I will consult orthopedics. I spoke with Dr. Joice LoftsPoggi, who will evaluate the patient tomorrow morning. The patient is likely to go to surgery tomorrow. I will continue pain control with as-needed morphine and Norco for now.   CODE STATUS: The patient is a full code.  TIME SPENT ON ADMISSION: 45 minutes.    ____________________________ Rolly PancakeVivek J. Cherlynn KaiserSainani, MD vjs:ST D: 02/16/2015 21:19:29 ET T: 02/16/2015 21:38:43 ET JOB#: [id number]  cc: Rolly PancakeVivek J. Cherlynn KaiserSainani, MD, <Dictator> Houston SirenVIVEK J Andrian Sabala MD ELECTRONICALLY SIGNED 02/27/2015 14:36

## 2015-04-16 NOTE — Consult Note (Signed)
Brief Consult Note: Diagnosis: Left Intertrochanteric hip fracture.   Patient was seen by consultant.   Consult note dictated.   Recommend to proceed with surgery or procedure.   Orders entered.   Discussed with Attending MD.   Comments: Pleasant elderly man with 2-part IT fracture. To OR for trochanteric femoral nailing later today as is cleared medically.  Thanks!.  Electronic Signatures: Derald MacleodPoggi, Jeffrey (MD)  (Signed 04-Mar-16 07:39)  Authored: Brief Consult Note   Last Updated: 04-Mar-16 07:39 by Derald MacleodPoggi, Jeffrey (MD)

## 2015-04-16 NOTE — Discharge Summary (Signed)
PATIENT NAME:  Philip [name], Philip [name] MR#:  [id number] DATE OF BIRTH:  [birth year]  DATE OF ADMISSION:  02/16/2015 DATE OF DISCHARGE:  02/21/2015   DISCHARGE DIAGNOSES:  1.  Left hip fracture.  2.  Acute blood loss anemia.  3.  Diabetes mellitus type 2.  4.  Hypertension.  5.  Thrombocytopenia.   CONSULTATIONS: Orthopedics.   PROCEDURES:  Open reduction and internal fixation of the left intertrochanteric hip fracture. This procedure was done on 02/17/2015.     BRIEF HISTORY AND HOSPITAL COURSE BY PROBLEM:  1.  Left hip fracture. The patient is an 81 year old Caucasian male who came into the ED after he had a mechanical fall. The patient was diagnosed with a left hip fracture, admitted to the hospital. Orthopedics was consulted. The patient has a moderate risk for surgery and he was cleared.  He had open reduction and internal fixation of the left hip, by Ortho.  The patient tolerated the procedure well.  Postoperatively, the patient was followed by Ortho and dressing care was provided by them.  The patient was evaluated by physical therapy.  He started working with PT.  Pain management was provided.  PT has recommended skilled nursing care.  2.  Acute blood loss anemia postoperatively. The patient's hemoglobin and hematocrit were monitored closely. The anemia was thought to be from acute blood loss secondary to the open reduction and internal fixation. Also from hemodilution. The patient's hemoglobin on 02/16/2015 was at 13.0 which was a 1 or 2 points from the hemoconcentration.  Subsequently, his hemoglobin was maintained between 9.5 and 10.  All this seemed to be from hemodilution, as well as from acute blood loss secondary to the procedure. No blood transfusions were provided during the hospital course.  3.  Thrombocytopenia.  Platelet count was initially at 127,000.  Subsequently, on 02/19/2015 it dropped down to 78,000 but on 02/20/2015 it is back to 112,000 again. The patient is started on  Lovenox 30 mg subcutaneous every 12 hours by orthopedics deep vein thrombosis prophylaxis.   4.  Diabetes mellitus type 2.  The plan is to continue diabetic diet. 5.  Left knee pain and neck pain.  Left knee x-ray and C-spine x-ray has revealed degenerative joint disease changes. The patient was recommended to continue taking his pain medications on as-needed basis along with the muscle relaxant as needed basis.   Over the hospital course was uneventful. The patient is getting transferred to a skilled nursing facility under stable condition.   CODE STATUS: FULL CODE.   PHYSICAL EXAMINATION:  VITAL SIGNS:  Temperature 98.4, pulse 87, respirations 18, blood pressure 120/65, pulse ox 94% at rest.   GENERAL APPEARANCE: Not in any acute distress. Moderately built and noted very hard of hearing, baseline dementia.  HEENT: Normocephalic, atraumatic. Pupils are equally reactive to light and accommodation. No conjunctival injection. Moist mucous membranes.  NECK: Supple. No JVD.  No thyromegaly, moving his neck with some discomfort from the neck strain  LUNGS: Clear to auscultation bilaterally. No accessory muscle use and no anterior chest wall tenderness on palpation.  CARDIAC: S1, S2 normal. Regular rate and rhythm. No bruits.  GASTROINTESTINAL: Soft. Bowel sounds are present in all 4 quadrants. Nontender, nondistended. No masses. NEUROLOGIC:  Awake, alert, and oriented x 3 very hard of hearing. Looks like she has some baseline dementia.  EXTREMITIES: Left hip, clean dressing. No peripheral edema. No cyanosis.  PSYCHIATRIC:  Flat mood and affect.   LABORATORY AND IMAGING DATA: The patient's Accu-Cheks  were at 123,135, 121. WBC 5.6, hemoglobin 9.3, hematocrit 28.6, platelets are 112,000, on 02/20/2015.    MEDICATIONS AT THE TIME OF DISCHARGE: Colace 100 mg p.o. once a day as needed for constipation, MiraLax 17 grams p.o. once daily as needed for constipation, tramadol 50 mg 1 tablet p.o. every 4 hours  as needed for moderate pain, Tylenol 325 mg 1 tablet every 4 to 6 hours as needed for mild pain, oxycodone 5 mg 1 tablet every 4 hours as needed for severe pain, Lovenox 30 mg subcutaneous 2 times a day, cyclobenzaprine 5 mg 1 tablet p.o. every 8 hours as needed for muscle spasms, Protonix 40 mg p.o. once daily. Tramadol and Percocet prescriptions were given by orthopedics.  Cyclobenzaprine prescription was provided by me.    WOUND CARE:  Dressing care as recommended by orthopedics.   DIET: Regular as tolerated.  PROPHYLAXIS:  Deep vein thrombosis as recommended by physical therapy as tolerated.   FOLLOW-UP   With primary care physician in a week and orthopedics as recommended by them in approximately 2 weeks.   The diagnosis and plan of care was discussed in detail with the patient and his family members and they all verbalized understanding of the plan.   TOTAL TIME SPENT ON THE DISCHARGE: 45 minutes.      ____________________________ Ramonita Lab, MD ag:DT D: 02/21/2015 14:35:36 ET T: 02/21/2015 15:03:34 ET JOB#: [id number]  cc: Ramonita Lab, MD, <Dictator> Maryagnes Amos, MD  Ramonita Lab MD ELECTRONICALLY SIGNED 03/06/2015 23:00

## 2015-04-16 NOTE — H&P (Signed)
PATIENT NAME:  Philip [name], Rawn R MR#:  [id number] DATE OF BIRTH:  28-Feb-1923  DATE OF ADMISSION:  03/20/2015  PRIMARY CARE PHYSICIAN: Dr. Larwance SachsBabaoff  CHIEF COMPLAINT: Shortness of breath.   HISTORY OF PRESENT ILLNESS: This is a 81 year old male who has been over at [facility] re[facility] after he had a left hip fracture. He developed shortness of breath last night, took an hour and a half for somebody to come and see him. He actually called his daughter on the phone. He was given some medications for reflux and Tylenol. He has been trying to cough things up, but could not get anything up. He has had a hot feeling. No chest pain, but just shortness of breath and cough. In the ER, a chest x-ray was read as diffuse interstitial prominence, may reflect atypical infection or less likely pulmonary edema with bibasilar nonspecific patchy airspace opacities. His troponin was also borderline and hospitalist services were contacted for further evaluation.   PAST MEDICAL HISTORY: Prostate cancer, chronic back pain.   PAST SURGICAL HISTORY: Left hip repair, right hip repair, left total knee replacement, cataracts, and prostatectomy.   ALLERGIES: PENICILLIN.   MEDICATIONS: As per prescription writer include acetaminophen 325 mg 1 tablet every 4 to 6 hours as needed for pain, Aspercreme 10% applied to effected area twice a day, Colace 100 mg once a day at bedtime as needed for constipation, MiraLax 17 grams once a day as needed for constipation, tramadol 50 mg every 4 hours as needed for moderate pain, vitamin D 1000 international units 2 capsules daily.   SOCIAL HISTORY: No smoking. No alcohol. No drug use. Is at [facility] rehab[facility] after a left hip fracture. Used to work as a Merchandiser, retailsupervisor.   FAMILY HISTORY: Mother died at 1994 of old age. Father died at 6092 of old age.   REVIEW OF SYSTEMS: CONSTITUTIONAL: Positive for hot feeling. No fever or chills. No weight loss. No weight gain. Positive for  weakness.  EYES: No blurry vision.  EARS, NOSE, MOUTH AND THROAT: Decreased hearing. No sore throat. No difficulty swallowing.  CARDIOVASCULAR: No chest pain. No palpitations.  RESPIRATORY: Positive for shortness of breath. Positive for cough. No sputum. No hemoptysis.  GASTROINTESTINAL: No nausea. No vomiting. No abdominal pain. No diarrhea. No constipation. Sometimes sees blood when he wipes.  GENITOURINARY: No burning on urination or hematuria.  MUSCULOSKELETAL: No joint pain or muscle pain.  INTEGUMENT: No rashes or eruptions.  NEUROLOGIC: No fainting or blackouts.  PSYCHIATRIC: Positive for anxiety   PHYSICAL EXAMINATION: VITAL SIGNS: On presentation to the ER, temperature 97.8, pulse 107, respirations 23, blood pressure 133/91, pulse ox 94% on room air.  GENERAL: No respiratory distress.  EYES: Conjunctivae and lids normal. Pupils equal, round, and reactive to light. Extraocular muscles intact. No nystagmus.  EARS, NOSE, MOUTH AND THROAT: Tympanic membranes: No erythema. Nasal mucosa: No erythema. Throat: No erythema. No exudate seen. Lips and gums: No lesions.  NECK: No JVD. No bruits. No lymphadenopathy. No thyromegaly. No thyroid nodules palpated.  RESPIRATORY: Decreased breath sounds bilaterally. Positive rales in bilateral bases. No use of accessory muscles to breathe.  CARDIOVASCULAR SYSTEM: S1 and S2, tachycardic. No gallops, rubs, or murmurs heard. Carotid upstroke 2+ bilaterally. No bruits. Dorsalis pedis pulses 1+ bilaterally, 3+ edema bilateral lower extremity.  ABDOMEN: Soft, nontender. No organosplenomegaly. Normoactive bowel sounds. No masses felt.  LYMPHATIC: No lymph nodes in the neck.  MUSCULOSKELETAL: 3+ lower extremity edema. No clubbing. No cyanosis.  SKIN: No rashes or  ulcers seen. Chronic lower extremity discoloration.  NEUROLOGIC: Cranial nerves II through XII grossly intact. Deep tendon reflexes 0.5+ bilateral lower extremities.  PSYCHIATRIC: The patient is  oriented to person, place, and time.   LABORATORY AND RADIOLOGICAL DATA: EKG: Sinus tachycardia, occasional premature ventricular complexes, Q waves anteroseptally.  Glucose 126, BUN 12, creatinine 0.94, sodium 141, potassium 3.8, chloride 107, CO2 25, calcium 8.4. Troponin borderline at 0.18. White blood cell count 8.4, hemoglobin and hematocrit 10.9 and 34.4 and platelet count 219,000.   Chest x-ray read as cardiomegaly, diffuse interstitial prominence. May reflect atypical infection or less likely pulmonary edema.   ASSESSMENT AND PLAN: 1.  Shortness of breath with borderline troponin. Right now unclear etiology. With his recent hip fracture repair, I will get a CT angio of the chest to rule out pulmonary embolism. I will get a flu swab for possible atypical pneumonia. We will start Levaquin for possible congestive heart failure. We will give 1 dose of Lasix now and 20 mg IV q. 12 hours, start low-dose metoprolol and obtain an echocardiogram. I will get serial cardiac enzymes and monitor on telemetry.  2.  For the patient's weakness and recent fracture, I will get a PT evaluation to determine whether or not he needs to go back to rehab or not.  3.  Chronic low back pain. Continue tramadol.  4.  Anemia. Likely from recent surgery.   CODE STATUS: The patient is a DNR.  TIME SPENT ON ADMISSION: 55 minutes.   ____________________________ Herschell Dimes. Renae Gloss, MD rjw:sb D: 03/20/2015 14:51:27 ET T: 03/20/2015 15:17:13 ET JOB#: [id number]  cc: Herschell Dimes. Renae Gloss, MD, <Dictator> Kandyce Rud, MD Salley Scarlet MD ELECTRONICALLY SIGNED 03/23/2015 15:42

## 2015-04-16 NOTE — Op Note (Signed)
PATIENT NAME:  Philip [name], [name] MR#:  [id number] DATE OF BIRTH:  01-03-1923  DATE OF PROCEDURE:  02/17/2015  PREOPERATIVE DIAGNOSIS: Two-part intertrochanteric fracture, left hip.   POSTOPERATIVE DIAGNOSIS: Two-part intertrochanteric fracture, left hip.   PROCEDURE: Reduction and internal fixation of the left intertrochanteric hip fracture using a [employer] Affixus trochanteric femoral nail with an 11 x 420 mm intramedullary nail and a 105 degree proximal interlocking lag screw.   SURGEON: Maryagnes AmosJ. Jeffrey Riki Gehring, MD   ANESTHESIA: Spinal.   FINDINGS: As noted above.   COMPLICATIONS: None.   ESTIMATED BLOOD LOSS: 100 mL.  TOTAL FLUIDS: 1300 mL of crystalloid.   URINE OUTPUT: 250 mL.   TOURNIQUET: None.   DRAINS: None.   CLOSURE: Staples.   BRIEF CLINICAL NOTE: The patient is an 81 year old male who sustained the above-noted injury yesterday afternoon while picking up sticks from his yard. Apparently, he lost his balance and fell, injuring his left hip. X-rays in the Emergency Room demonstrated the above-noted findings. He has been cleared medically and presents at this time for definitive management of his injury.   DESCRIPTION OF PROCEDURE: The patient was brought into the Operating Room. After adequate spinal anesthesia was obtained, he was lain in the supine position on the fracture table. The right leg was placed in a flexed and abducted position in the Well Leg Holder while the left lower extremity was placed in longitudinal traction. The fracture was reduced using longitudinal traction and internal rotation as verified fluoroscopically in AP and lateral projections. Once adequate reduction was achieved, the lateral aspect of the left hip and thigh were prepped with ChloraPrep solution before being draped sterilely. Preoperative antibiotics were administered. The central portion of the femur was marked anteriorly and laterally on the skin to facilitate guidewire positioning. An  approximately 3 cm incision was made approximately 4 to 5 cm above the greater trochanter. The incision was carried down through the subcutaneous tissues to provide access to the tip of the greater trochanter. The intramedullary canal was accessed through this point using a guidewire after verifying its entry point fluoroscopically in AP and lateral projections. Once it was positioned appropriately in the proximal metaphysis, it was over drilled with a 15 mm reamer. The beaded guidewire was passed down through the femoral canal into the knee. Its position again was verified fluoroscopically in AP and lateral projections. The femoral canal was reamed sequentially, beginning with an 8.5 mm reamer, progressing to a 12.5 mm reamer. The length of the nail was measured and found to be 420 mm. The 11 x 420 mm nail was selected and impacted into place. The lag screw was positioned through a separate stab incision laterally after ensuring that the guidewire was in the appropriate placed the femoral neck and head in AP and lateral projections. It was measured then overreamed to the appropriate depth. The 105 mm lag screw was inserted and advanced to the appropriate depth. The locking screw was inserted and tightened securely then backed off a quarter turn. The adequacy of hardware position in AP and lateral projections both proximally and distally was verified fluoroscopically. It was elected not to proceed with a distal interlocking screw due both to the fact that the fracture was deemed stable and that he did have a knee prosthesis distally. Therefore, putting avoidance of the distal interlocking screw may help to reduce a stress riser in this area.   The wounds were copiously irrigated with sterile saline solution before the gluteal fascia and IT band  were closed using #0 Vicryl interrupted sutures in the respective incisions. The subcutaneous tissues of both wounds were closed using 2-0 Vicryl interrupted sutures before  the skin was closed using staples. A sterile bulky dressing was applied to both wounds before the patient was transferred back to his hospital bed. He was then awakened and returned to the recovery room in satisfactory condition after tolerating the procedure well.   ____________________________ J. Derald Macleod, MD jjp:at D: 02/17/2015 13:40:13 ET T: 02/17/2015 15:46:34 ET JOB#: [id number]  cc: Maryagnes Amos, MD, <Dictator> Maryagnes Amos MD ELECTRONICALLY SIGNED 02/21/2015 14:20

## 2015-04-27 IMAGING — CT CT ANGIO CHEST
2 of 6 series · 18 of 36 positions shown · IV contrast (omnipaque)
Comparison: Chest radiograph March 20, 2015

CLINICAL DATA: Shortness of Breath

EXAM:
CT ANGIOGRAPHY CHEST WITH CONTRAST
TECHNIQUE: Multidetector CT imaging of the chest was performed using the
standard protocol during bolus administration of intravenous
contrast. Multiplanar CT image reconstructions and MIPs were
obtained to evaluate the vascular anatomy.
CONTRAST:  100 mL Omnipaque 350 nonionic

[Series 5: pe 1.0 thins · axial · 0.81mm/px · z∈[-261,-6]mm · 17 of 287 slices shown]
[im 16/287  lung]
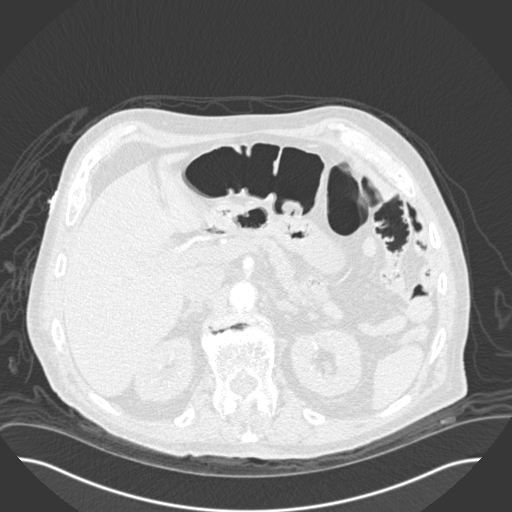
[im 32/287  mediastinal]
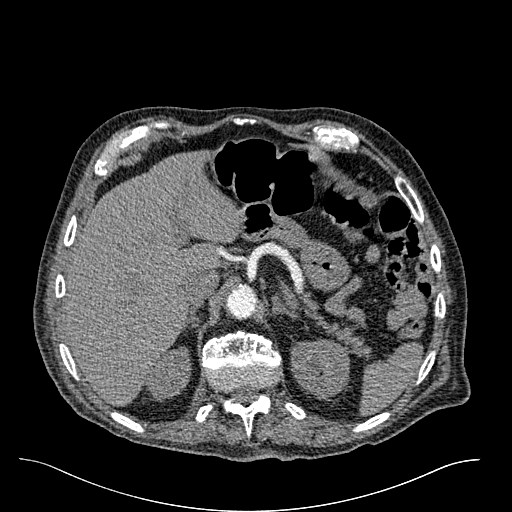
[im 48/287  lung]
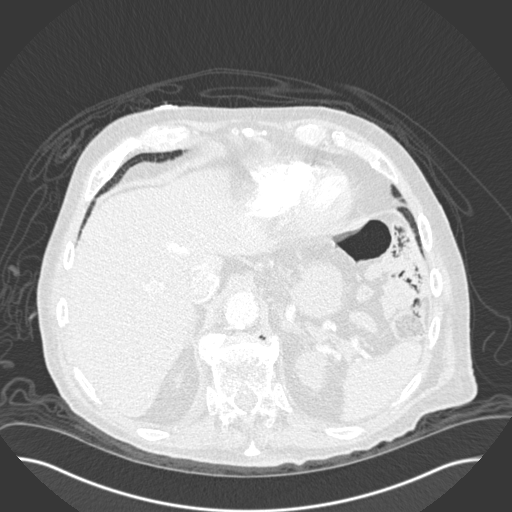
[im 64/287  mediastinal]
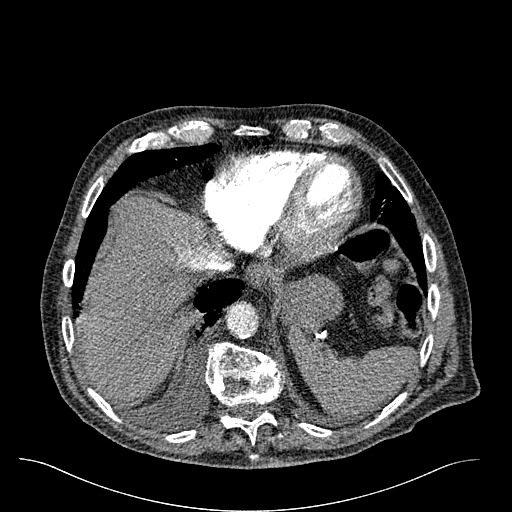
[im 80/287  lung]
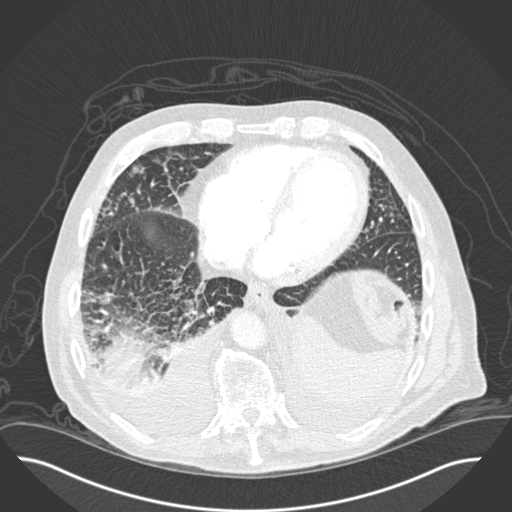
[im 96/287  mediastinal]
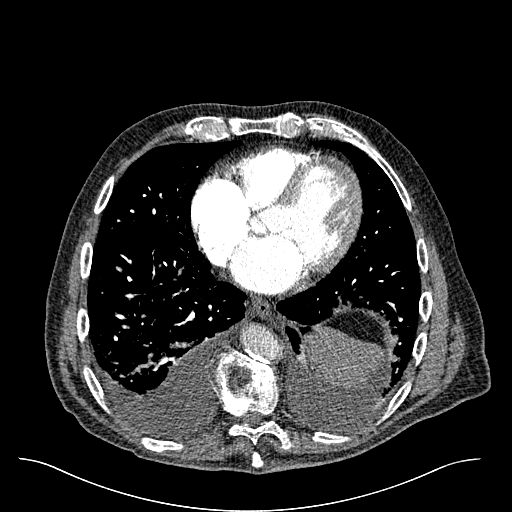
[im 112/287  lung]
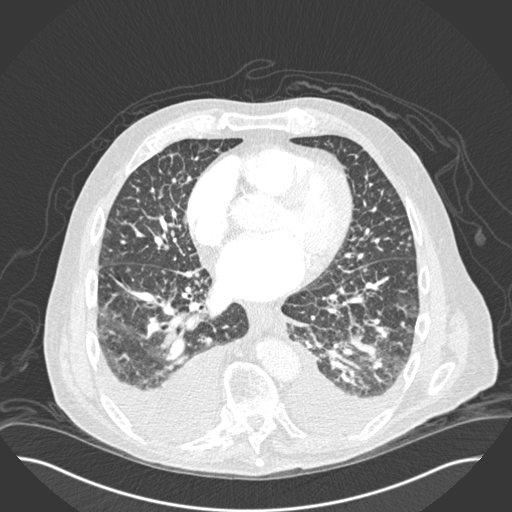
[im 128/287  mediastinal]
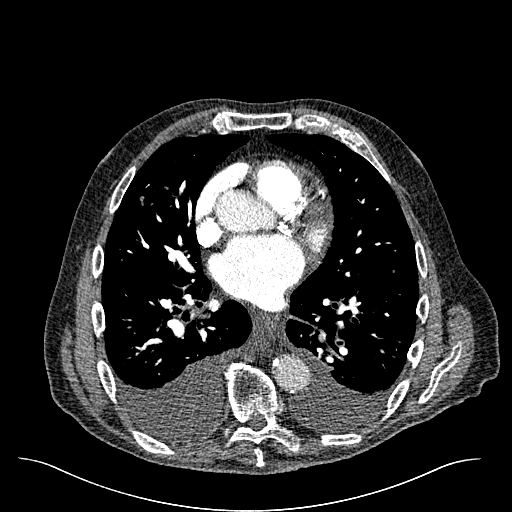
[im 144/287  lung]
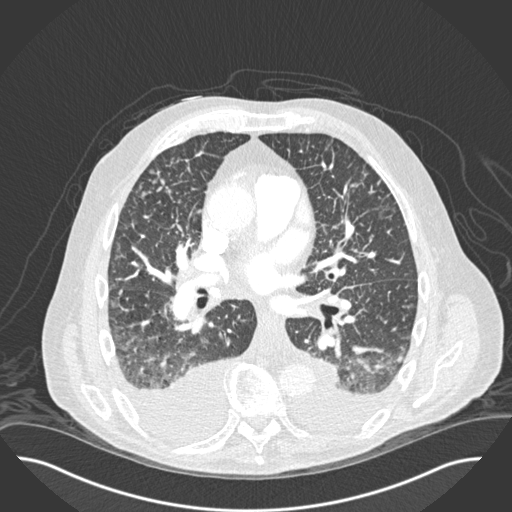
[im 159/287  mediastinal]
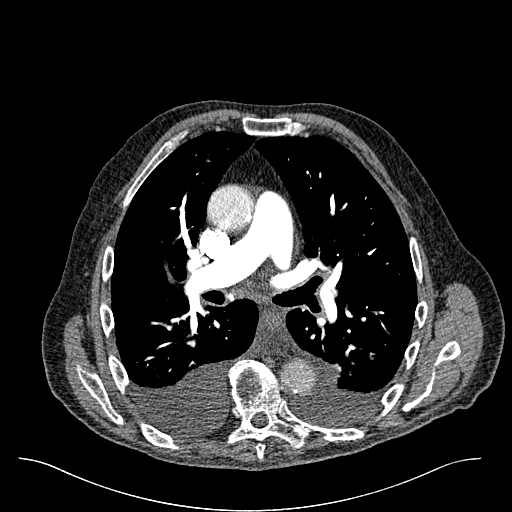
[im 175/287  lung]
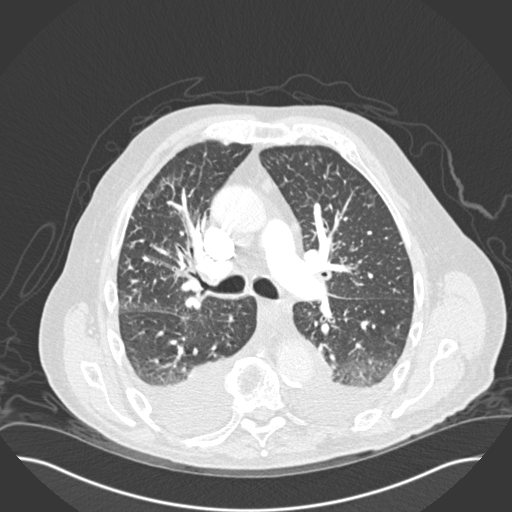
[im 191/287  mediastinal]
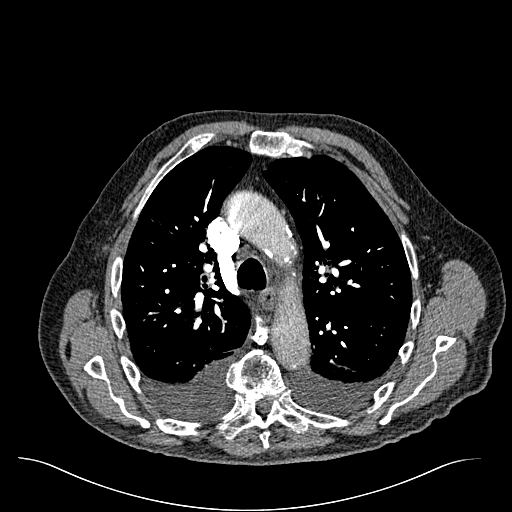
[im 207/287  lung]
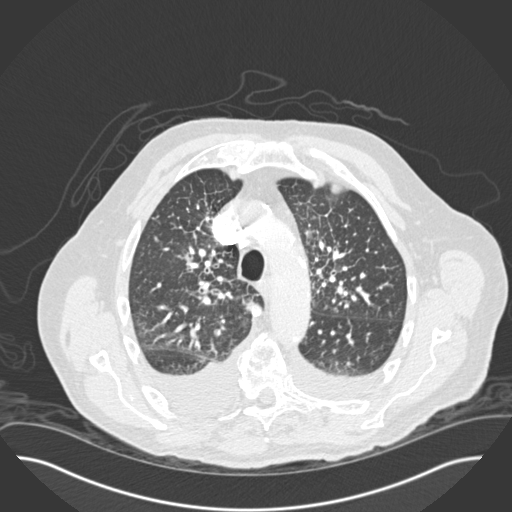
[im 223/287  mediastinal]
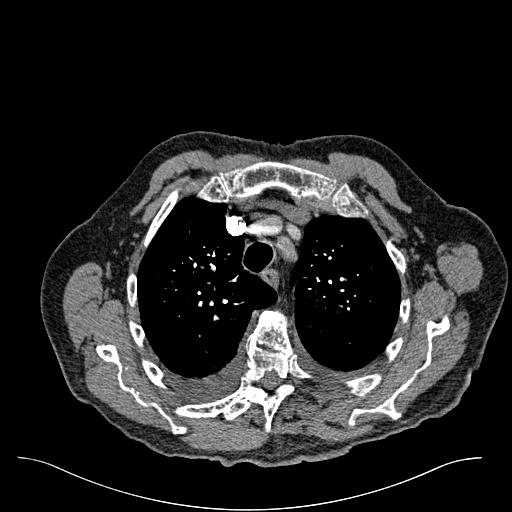
[im 239/287  lung]
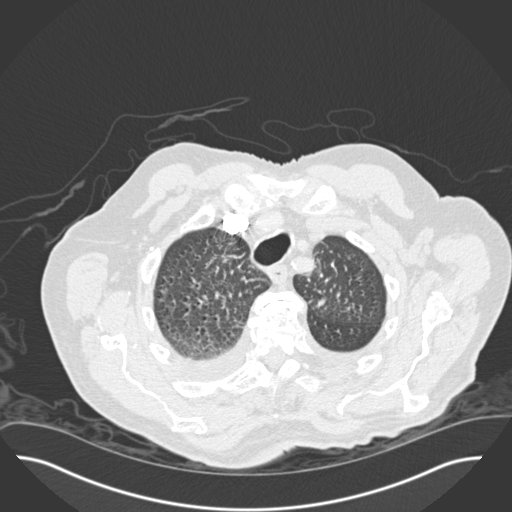
[im 255/287  mediastinal]
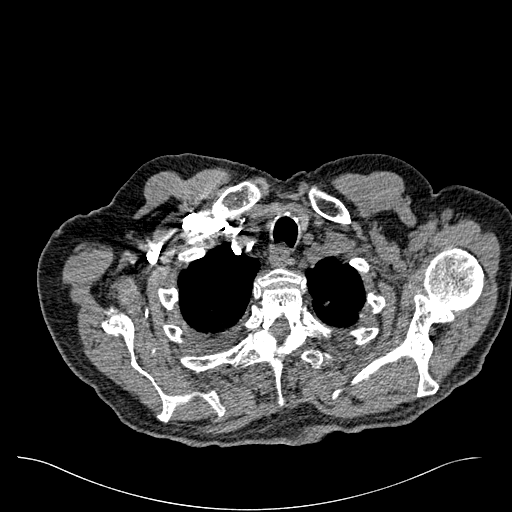
[im 271/287  lung]
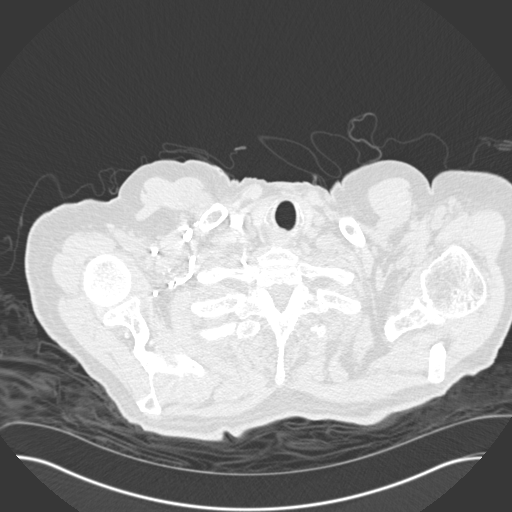

[Series 7: cor pe 2.0 mpr · coronal · 0.68mm/px · 1 of 152 slices shown]
[im 76/152  mediastinal]
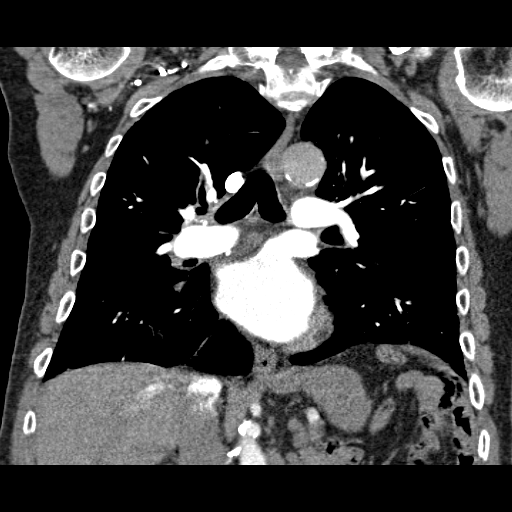

[18 of 36 positions shown; findings below may reference images not displayed]

FINDINGS: There is no demonstrable pulmonary embolus. There is no thoracic
aortic aneurysm or dissection. Atherosclerotic change is noted in
the aorta.

There are moderate pleural effusions bilaterally. There is
generalized interstitial edema. There is bibasilar atelectatic
change with mild lower lobe alveolar edema bilaterally.

On axial slice 22 series 6, there is a somewhat wedge-shaped area of
opacity abutting the major fissure in the left upper lobe near the
apex measuring 1.5 x 1.2 cm.

There are scattered subcentimeter lymph nodes, but there is no
adenopathy meeting size criteria for pathologic significance. There
is calcification in the left anterior descending and circumflex
coronary arteries. Pericardium is not thickened. Thyroid appears
normal.

In the visualized upper abdomen, no focal lesion is identified
beyond atherosclerotic change in the aorta. There is degenerative
change in the thoracic spine. There are no blastic or lytic bone
lesions. There are multiple syndesmophytes in the thoracic region.

Review of the MIP images confirms the above findings.
IMPRESSION: No demonstrable pulmonary embolus.

Evidence of congestive heart failure.

Focal opacity abutting the major fissure in the left upper lobe near
the apex. Suspect loculated effusion in this area. As a mass could
present in this manner, a followup study in 4-6 weeks to assess for
stability would be reasonable.

No appreciable adenopathy.

Appearance of the thoracic spine raises question of underlying
seronegative spondyloarthropathy.

## 2016-04-15 DEATH — deceased
